# Patient Record
Sex: Female | Born: 1960
Health system: Southern US, Community
[De-identification: ages and names within clinical notes are randomized; demographics above are authoritative.]

---

## 1999-12-13 ENCOUNTER — Ambulatory Visit (HOSPITAL_BASED_OUTPATIENT_CLINIC_OR_DEPARTMENT_OTHER): Admission: RE | Admit: 1999-12-13 | Discharge: 1999-12-13 | Payer: Self-pay | Admitting: *Deleted

## 2002-02-18 ENCOUNTER — Other Ambulatory Visit: Admission: RE | Admit: 2002-02-18 | Discharge: 2002-02-18 | Payer: Self-pay | Admitting: Obstetrics and Gynecology

## 2003-02-18 ENCOUNTER — Other Ambulatory Visit: Admission: RE | Admit: 2003-02-18 | Discharge: 2003-02-18 | Payer: Self-pay | Admitting: Obstetrics and Gynecology

## 2004-07-03 ENCOUNTER — Other Ambulatory Visit: Admission: RE | Admit: 2004-07-03 | Discharge: 2004-07-03 | Payer: Self-pay | Admitting: Obstetrics and Gynecology

## 2005-01-04 ENCOUNTER — Other Ambulatory Visit: Admission: RE | Admit: 2005-01-04 | Discharge: 2005-01-04 | Payer: Self-pay | Admitting: Obstetrics and Gynecology

## 2005-07-25 ENCOUNTER — Other Ambulatory Visit: Admission: RE | Admit: 2005-07-25 | Discharge: 2005-07-25 | Payer: Self-pay | Admitting: Obstetrics and Gynecology

## 2010-03-15 ENCOUNTER — Encounter: Admission: RE | Admit: 2010-03-15 | Discharge: 2010-03-15 | Payer: Self-pay | Admitting: Internal Medicine

## 2010-05-18 ENCOUNTER — Ambulatory Visit (HOSPITAL_COMMUNITY): Admission: RE | Admit: 2010-05-18 | Discharge: 2010-05-18 | Payer: Self-pay | Admitting: Surgery

## 2010-09-21 ENCOUNTER — Ambulatory Visit (HOSPITAL_COMMUNITY): Admission: RE | Admit: 2010-09-21 | Discharge: 2010-09-21 | Payer: Self-pay | Admitting: Gastroenterology

## 2010-11-26 ENCOUNTER — Encounter: Payer: Self-pay | Admitting: Obstetrics and Gynecology

## 2011-01-04 ENCOUNTER — Other Ambulatory Visit: Payer: Self-pay | Admitting: Dermatology

## 2011-01-21 LAB — COMPREHENSIVE METABOLIC PANEL
ALT: 16 U/L (ref 0–35)
AST: 18 U/L (ref 0–37)
BUN: 14 mg/dL (ref 6–23)
GFR calc Af Amer: >60 mL/min (ref >60)
Glucose, Bld: 101 mg/dL — ABNORMAL HIGH (ref 70–99)
Sodium: 138 mEq/L (ref 135–145)
Total Bilirubin: 0.4 mg/dL (ref 0.3–1.2)

## 2011-01-21 LAB — CBC
HCT: 33.2 % — ABNORMAL LOW (ref 36.0–46.0)
MCHC: 34.4 g/dL (ref 30.0–36.0)
MCV: 85.1 fL (ref 78.0–100.0)

## 2011-01-21 LAB — SURGICAL PCR SCREEN: Staphylococcus aureus: POSITIVE — AB

## 2011-03-02 ENCOUNTER — Other Ambulatory Visit: Payer: Self-pay | Admitting: Dermatology

## 2011-03-23 NOTE — Op Note (Signed)
[place]  Patient:    Debbie Wallace, Debbie Wallace                          MRN: [id number] Proc. Date: 12/13/99 Adm. Date:  [id number] Attending:  Stephenie Acres                           Operative Report  PREOPERATIVE DIAGNOSIS:  Left breast mass.  POSTOPERATIVE DIAGNOSIS:  Left breast mass.  OPERATION PERFORMED:  Excisional left breast biopsy.  SURGEON:  Stephenie Acres, M.D.  ANESTHESIA:  General.  DESCRIPTION OF PROCEDURE:  The patient was taken to the operating room and placed in supine position.  After adequate anesthesia was induced using MAC technique, the left breast was prepped and draped in normal sterile fashion.  Using 1% lidocaine local anesthesia, the skin and subcutaneous tissue surrounding the palpable mass was anesthetized.  A curvilinear peri-areolar incision was made in the 5 oclock  region of the left breast.  Dissected down excising the mass in its entirely and surrounding tissue.  Adequate hemostasis was ensured and the skin was closed with subcuticular 4-0 Monocryl.  Steri-Strips, sterile dressings were applied.  The patient tolerated the procedure well and went to PACU in good condition. DD:  12/13/99 TD:  12/13/99 Job: [id number]

## 2014-08-25 ENCOUNTER — Other Ambulatory Visit: Payer: Self-pay | Admitting: Obstetrics and Gynecology

## 2014-08-26 LAB — CYTOLOGY - PAP

## 2014-09-01 ENCOUNTER — Other Ambulatory Visit: Payer: Self-pay | Admitting: Obstetrics and Gynecology

## 2014-09-01 DIAGNOSIS — R928 Other abnormal and inconclusive findings on diagnostic imaging of breast: Secondary | ICD-10-CM

## 2014-09-13 ENCOUNTER — Ambulatory Visit
Admission: RE | Admit: 2014-09-13 | Discharge: 2014-09-13 | Disposition: A | Discharge status: Home / Self Care | Payer: BC Managed Care – PPO | Source: Ambulatory Visit | Attending: Obstetrics and Gynecology | Admitting: Obstetrics and Gynecology

## 2014-09-13 DIAGNOSIS — R928 Other abnormal and inconclusive findings on diagnostic imaging of breast: Secondary | ICD-10-CM

## 2016-04-23 DIAGNOSIS — E782 Mixed hyperlipidemia: Secondary | ICD-10-CM | POA: Diagnosis not present

## 2016-04-23 DIAGNOSIS — E039 Hypothyroidism, unspecified: Secondary | ICD-10-CM | POA: Diagnosis not present

## 2016-04-23 DIAGNOSIS — Z0000 Encounter for general adult medical examination without abnormal findings: Secondary | ICD-10-CM | POA: Diagnosis not present

## 2016-06-28 DIAGNOSIS — E039 Hypothyroidism, unspecified: Secondary | ICD-10-CM | POA: Diagnosis not present

## 2016-10-04 DIAGNOSIS — Z01419 Encounter for gynecological examination (general) (routine) without abnormal findings: Secondary | ICD-10-CM | POA: Diagnosis not present

## 2016-10-04 DIAGNOSIS — Z01411 Encounter for gynecological examination (general) (routine) with abnormal findings: Secondary | ICD-10-CM | POA: Diagnosis not present

## 2016-10-04 DIAGNOSIS — Z6836 Body mass index (BMI) 36.0-36.9, adult: Secondary | ICD-10-CM | POA: Diagnosis not present

## 2016-10-04 DIAGNOSIS — N390 Urinary tract infection, site not specified: Secondary | ICD-10-CM | POA: Diagnosis not present

## 2017-01-10 DIAGNOSIS — D1801 Hemangioma of skin and subcutaneous tissue: Secondary | ICD-10-CM | POA: Diagnosis not present

## 2017-01-10 DIAGNOSIS — D2361 Other benign neoplasm of skin of right upper limb, including shoulder: Secondary | ICD-10-CM | POA: Diagnosis not present

## 2017-01-10 DIAGNOSIS — L820 Inflamed seborrheic keratosis: Secondary | ICD-10-CM | POA: Diagnosis not present

## 2017-01-10 DIAGNOSIS — L918 Other hypertrophic disorders of the skin: Secondary | ICD-10-CM | POA: Diagnosis not present

## 2017-01-10 DIAGNOSIS — L821 Other seborrheic keratosis: Secondary | ICD-10-CM | POA: Diagnosis not present

## 2017-01-10 DIAGNOSIS — D2371 Other benign neoplasm of skin of right lower limb, including hip: Secondary | ICD-10-CM | POA: Diagnosis not present

## 2017-03-28 DIAGNOSIS — W57XXXA Bitten or stung by nonvenomous insect and other nonvenomous arthropods, initial encounter: Secondary | ICD-10-CM | POA: Diagnosis not present

## 2017-03-28 DIAGNOSIS — S30861A Insect bite (nonvenomous) of abdominal wall, initial encounter: Secondary | ICD-10-CM | POA: Diagnosis not present

## 2017-06-03 DIAGNOSIS — E039 Hypothyroidism, unspecified: Secondary | ICD-10-CM | POA: Diagnosis not present

## 2017-06-03 DIAGNOSIS — E782 Mixed hyperlipidemia: Secondary | ICD-10-CM | POA: Diagnosis not present

## 2017-08-29 DIAGNOSIS — Z23 Encounter for immunization: Secondary | ICD-10-CM | POA: Diagnosis not present

## 2017-08-29 DIAGNOSIS — G8929 Other chronic pain: Secondary | ICD-10-CM | POA: Diagnosis not present

## 2017-08-29 DIAGNOSIS — M25511 Pain in right shoulder: Secondary | ICD-10-CM | POA: Diagnosis not present

## 2017-10-15 DIAGNOSIS — Z01419 Encounter for gynecological examination (general) (routine) without abnormal findings: Secondary | ICD-10-CM | POA: Diagnosis not present

## 2017-10-15 DIAGNOSIS — Z1231 Encounter for screening mammogram for malignant neoplasm of breast: Secondary | ICD-10-CM | POA: Diagnosis not present

## 2017-10-15 DIAGNOSIS — Z6834 Body mass index (BMI) 34.0-34.9, adult: Secondary | ICD-10-CM | POA: Diagnosis not present

## 2017-11-13 DIAGNOSIS — Z13820 Encounter for screening for osteoporosis: Secondary | ICD-10-CM | POA: Diagnosis not present

## 2018-05-03 DIAGNOSIS — L259 Unspecified contact dermatitis, unspecified cause: Secondary | ICD-10-CM | POA: Diagnosis not present

## 2018-05-03 DIAGNOSIS — H02843 Edema of right eye, unspecified eyelid: Secondary | ICD-10-CM | POA: Diagnosis not present

## 2018-05-09 DIAGNOSIS — Z0000 Encounter for general adult medical examination without abnormal findings: Secondary | ICD-10-CM | POA: Diagnosis not present

## 2018-05-09 DIAGNOSIS — E782 Mixed hyperlipidemia: Secondary | ICD-10-CM | POA: Diagnosis not present

## 2018-05-09 DIAGNOSIS — Z23 Encounter for immunization: Secondary | ICD-10-CM | POA: Diagnosis not present

## 2018-05-09 DIAGNOSIS — E039 Hypothyroidism, unspecified: Secondary | ICD-10-CM | POA: Diagnosis not present

## 2018-05-09 DIAGNOSIS — E669 Obesity, unspecified: Secondary | ICD-10-CM | POA: Diagnosis not present

## 2018-06-25 DIAGNOSIS — E039 Hypothyroidism, unspecified: Secondary | ICD-10-CM | POA: Diagnosis not present

## 2018-08-25 DIAGNOSIS — E039 Hypothyroidism, unspecified: Secondary | ICD-10-CM | POA: Diagnosis not present

## 2018-10-31 DIAGNOSIS — E039 Hypothyroidism, unspecified: Secondary | ICD-10-CM | POA: Diagnosis not present

## 2018-11-19 DIAGNOSIS — D2262 Melanocytic nevi of left upper limb, including shoulder: Secondary | ICD-10-CM | POA: Diagnosis not present

## 2018-11-19 DIAGNOSIS — D2361 Other benign neoplasm of skin of right upper limb, including shoulder: Secondary | ICD-10-CM | POA: Diagnosis not present

## 2018-11-19 DIAGNOSIS — L738 Other specified follicular disorders: Secondary | ICD-10-CM | POA: Diagnosis not present

## 2018-11-19 DIAGNOSIS — L820 Inflamed seborrheic keratosis: Secondary | ICD-10-CM | POA: Diagnosis not present

## 2018-11-19 DIAGNOSIS — L821 Other seborrheic keratosis: Secondary | ICD-10-CM | POA: Diagnosis not present

## 2019-07-20 DIAGNOSIS — Z13228 Encounter for screening for other metabolic disorders: Secondary | ICD-10-CM | POA: Diagnosis not present

## 2019-07-20 DIAGNOSIS — Z1231 Encounter for screening mammogram for malignant neoplasm of breast: Secondary | ICD-10-CM | POA: Diagnosis not present

## 2019-07-20 DIAGNOSIS — Z01419 Encounter for gynecological examination (general) (routine) without abnormal findings: Secondary | ICD-10-CM | POA: Diagnosis not present

## 2019-07-20 DIAGNOSIS — Z6831 Body mass index (BMI) 31.0-31.9, adult: Secondary | ICD-10-CM | POA: Diagnosis not present

## 2019-07-20 DIAGNOSIS — Z13220 Encounter for screening for lipoid disorders: Secondary | ICD-10-CM | POA: Diagnosis not present

## 2019-07-20 DIAGNOSIS — Z1329 Encounter for screening for other suspected endocrine disorder: Secondary | ICD-10-CM | POA: Diagnosis not present

## 2019-07-22 ENCOUNTER — Other Ambulatory Visit: Payer: Self-pay | Admitting: Obstetrics and Gynecology

## 2019-07-22 DIAGNOSIS — R928 Other abnormal and inconclusive findings on diagnostic imaging of breast: Secondary | ICD-10-CM

## 2019-07-28 ENCOUNTER — Other Ambulatory Visit: Payer: Self-pay

## 2019-07-28 ENCOUNTER — Ambulatory Visit
Admission: RE | Admit: 2019-07-28 | Discharge: 2019-07-28 | Disposition: A | Discharge status: Home / Self Care | Payer: BC Managed Care – PPO | Source: Ambulatory Visit | Attending: Obstetrics and Gynecology | Admitting: Obstetrics and Gynecology

## 2019-07-28 ENCOUNTER — Other Ambulatory Visit: Payer: Self-pay | Admitting: Obstetrics and Gynecology

## 2019-07-28 DIAGNOSIS — N6322 Unspecified lump in the left breast, upper inner quadrant: Secondary | ICD-10-CM | POA: Diagnosis not present

## 2019-07-28 DIAGNOSIS — R928 Other abnormal and inconclusive findings on diagnostic imaging of breast: Secondary | ICD-10-CM

## 2019-07-28 DIAGNOSIS — N6320 Unspecified lump in the left breast, unspecified quadrant: Secondary | ICD-10-CM

## 2019-07-30 ENCOUNTER — Other Ambulatory Visit: Payer: Self-pay | Admitting: Obstetrics and Gynecology

## 2019-08-20 ENCOUNTER — Ambulatory Visit
Admission: RE | Admit: 2019-08-20 | Discharge: 2019-08-20 | Disposition: A | Discharge status: Home / Self Care | Payer: BLUE CROSS/BLUE SHIELD | Source: Ambulatory Visit | Attending: Internal Medicine | Admitting: Internal Medicine

## 2019-08-20 ENCOUNTER — Other Ambulatory Visit: Payer: Self-pay | Admitting: Internal Medicine

## 2019-08-20 DIAGNOSIS — E039 Hypothyroidism, unspecified: Secondary | ICD-10-CM | POA: Diagnosis not present

## 2019-08-20 DIAGNOSIS — R05 Cough: Secondary | ICD-10-CM | POA: Diagnosis not present

## 2019-08-20 DIAGNOSIS — E782 Mixed hyperlipidemia: Secondary | ICD-10-CM | POA: Diagnosis not present

## 2019-08-20 DIAGNOSIS — R059 Cough, unspecified: Secondary | ICD-10-CM

## 2019-08-20 DIAGNOSIS — Z23 Encounter for immunization: Secondary | ICD-10-CM | POA: Diagnosis not present

## 2019-12-04 DIAGNOSIS — E039 Hypothyroidism, unspecified: Secondary | ICD-10-CM | POA: Diagnosis not present

## 2020-01-26 ENCOUNTER — Other Ambulatory Visit: Payer: BC Managed Care – PPO

## 2020-01-26 ENCOUNTER — Other Ambulatory Visit: Payer: Self-pay

## 2020-02-01 DIAGNOSIS — Z20828 Contact with and (suspected) exposure to other viral communicable diseases: Secondary | ICD-10-CM | POA: Diagnosis not present

## 2020-02-01 DIAGNOSIS — Z03818 Encounter for observation for suspected exposure to other biological agents ruled out: Secondary | ICD-10-CM | POA: Diagnosis not present

## 2020-03-21 ENCOUNTER — Other Ambulatory Visit: Payer: Self-pay

## 2020-04-29 ENCOUNTER — Other Ambulatory Visit: Payer: Self-pay

## 2020-04-29 ENCOUNTER — Ambulatory Visit
Admission: RE | Admit: 2020-04-29 | Discharge: 2020-04-29 | Disposition: A | Discharge status: Home / Self Care | Payer: BC Managed Care – PPO | Source: Ambulatory Visit | Attending: Obstetrics and Gynecology | Admitting: Obstetrics and Gynecology

## 2020-04-29 ENCOUNTER — Other Ambulatory Visit: Payer: Self-pay | Admitting: Obstetrics and Gynecology

## 2020-04-29 DIAGNOSIS — N6489 Other specified disorders of breast: Secondary | ICD-10-CM | POA: Diagnosis not present

## 2020-04-29 DIAGNOSIS — N6320 Unspecified lump in the left breast, unspecified quadrant: Secondary | ICD-10-CM

## 2020-04-29 DIAGNOSIS — R928 Other abnormal and inconclusive findings on diagnostic imaging of breast: Secondary | ICD-10-CM | POA: Diagnosis not present

## 2020-05-06 ENCOUNTER — Ambulatory Visit
Admission: RE | Admit: 2020-05-06 | Discharge: 2020-05-06 | Disposition: A | Discharge status: Home / Self Care | Payer: BC Managed Care – PPO | Source: Ambulatory Visit | Attending: Obstetrics and Gynecology | Admitting: Obstetrics and Gynecology

## 2020-05-06 ENCOUNTER — Other Ambulatory Visit: Payer: Self-pay

## 2020-05-06 DIAGNOSIS — N6320 Unspecified lump in the left breast, unspecified quadrant: Secondary | ICD-10-CM

## 2020-05-06 DIAGNOSIS — N6322 Unspecified lump in the left breast, upper inner quadrant: Secondary | ICD-10-CM | POA: Diagnosis not present

## 2020-05-06 DIAGNOSIS — N6012 Diffuse cystic mastopathy of left breast: Secondary | ICD-10-CM | POA: Diagnosis not present

## 2020-07-29 IMAGING — US US BREAST*L* LIMITED INC AXILLA
1 series · 9 of 9 positions shown · non-contrast
Comparison: Previous exam(s).

CLINICAL DATA: Screening recall for a possible left breast mass.

EXAM:
DIGITAL DIAGNOSTIC LEFT MAMMOGRAM WITH CAD AND TOMO
ULTRASOUND LEFT BREAST

[Series 1: us breast*left* limited inc axilla · 0.06mm/px · 9 of 9 slices shown]
[im 1/9]
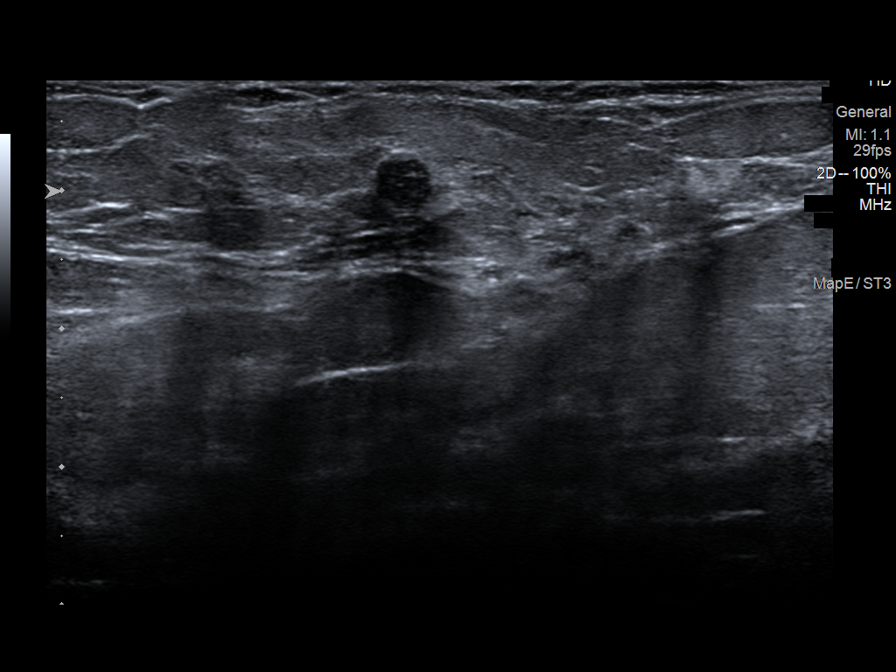
[im 2/9]
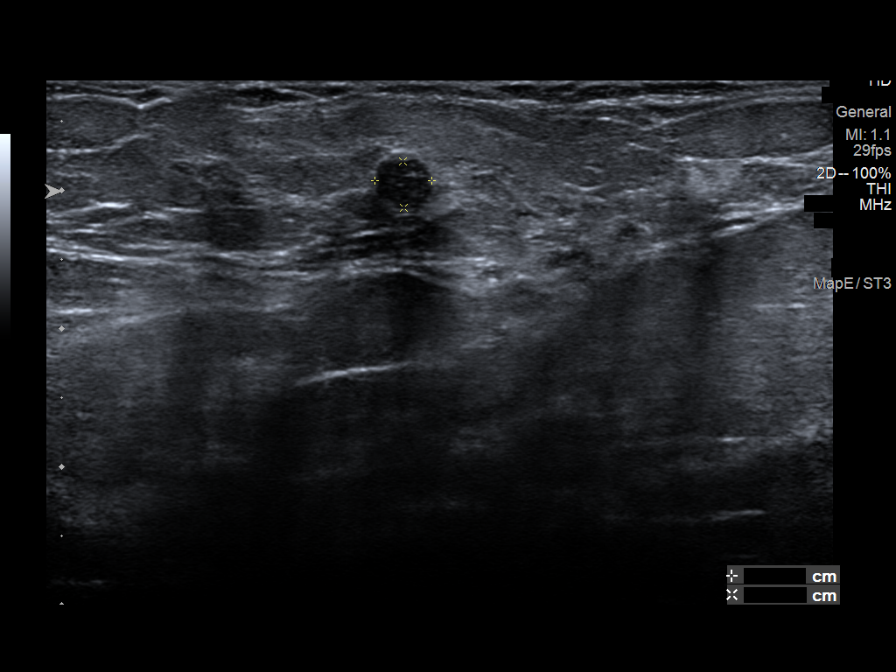
[im 3/9]
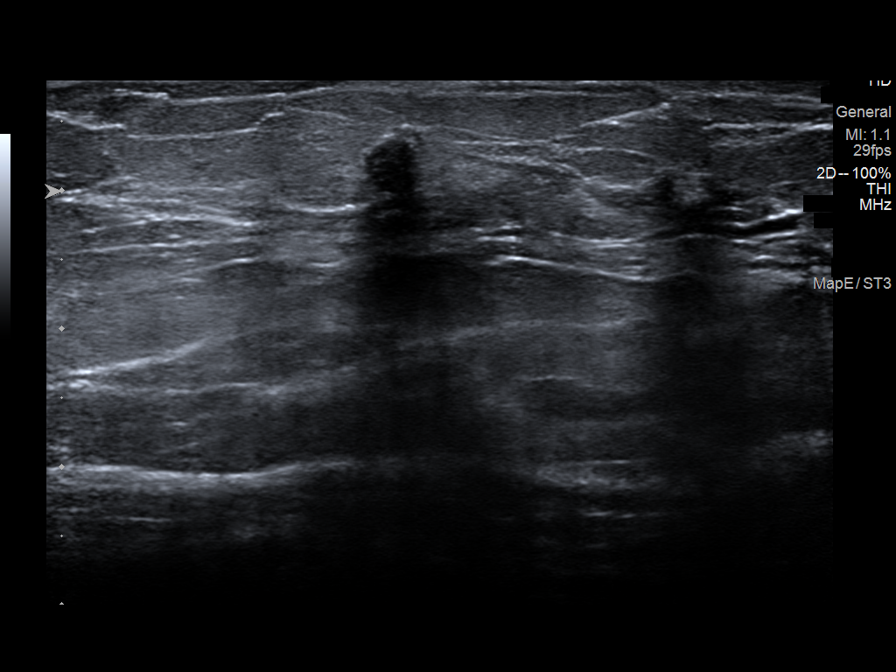
[im 4/9]
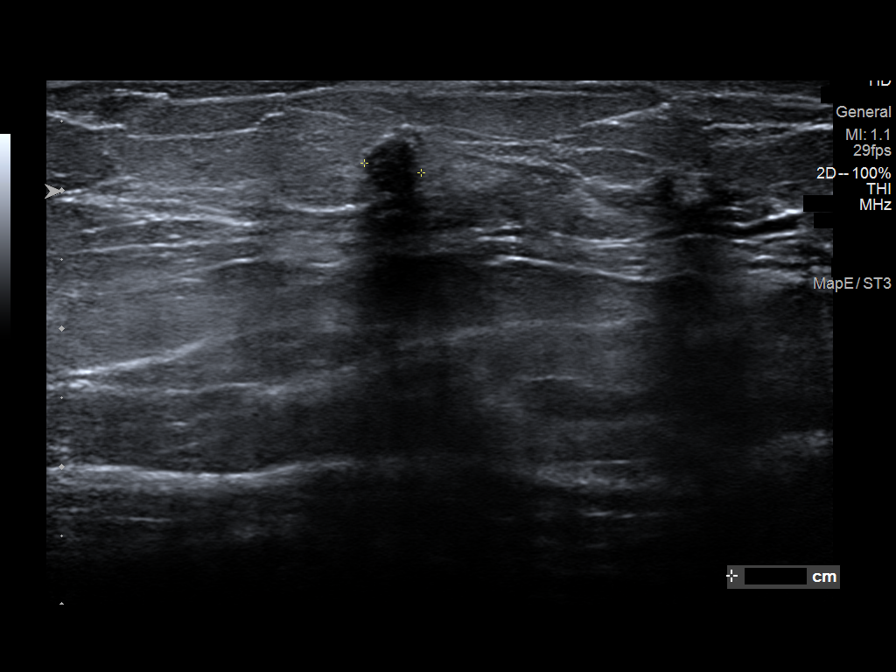
[im 5/9]
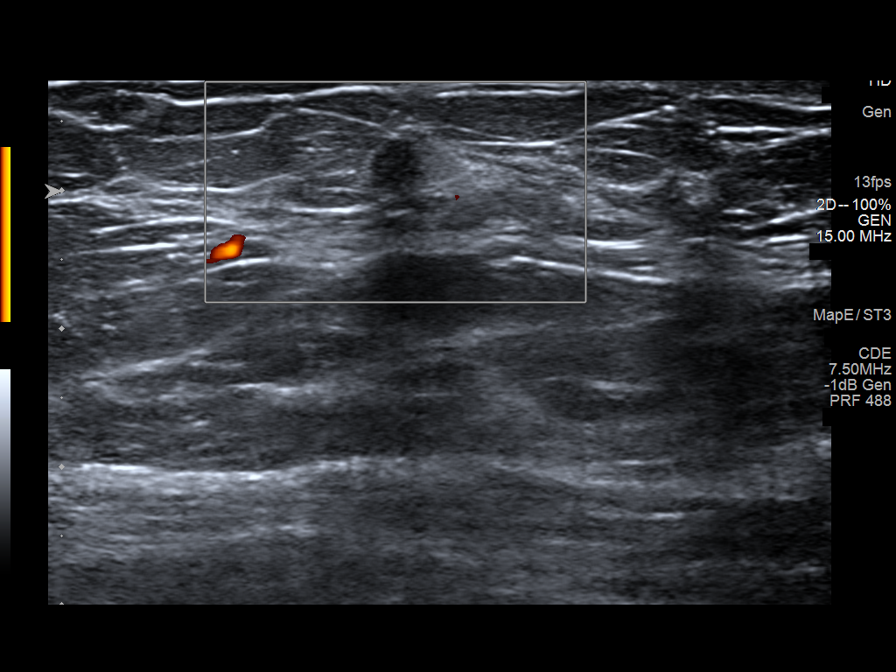
[im 6/9]
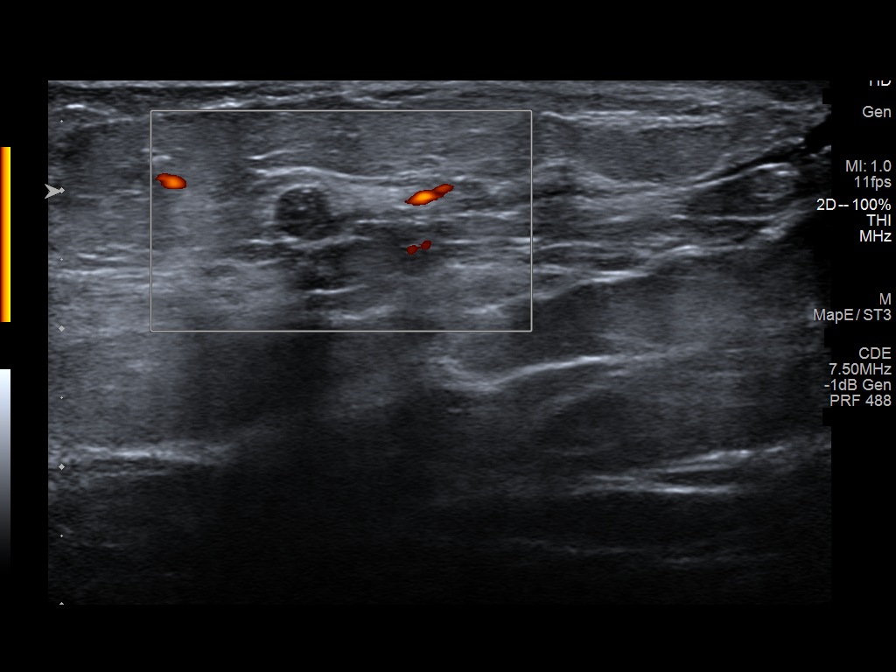
[im 7/9]
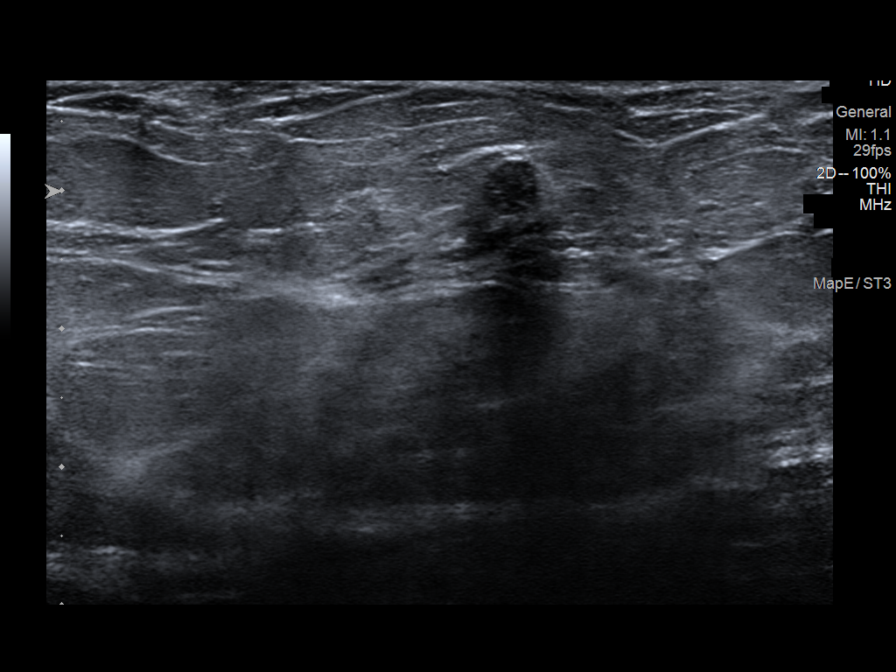
[im 8/9]
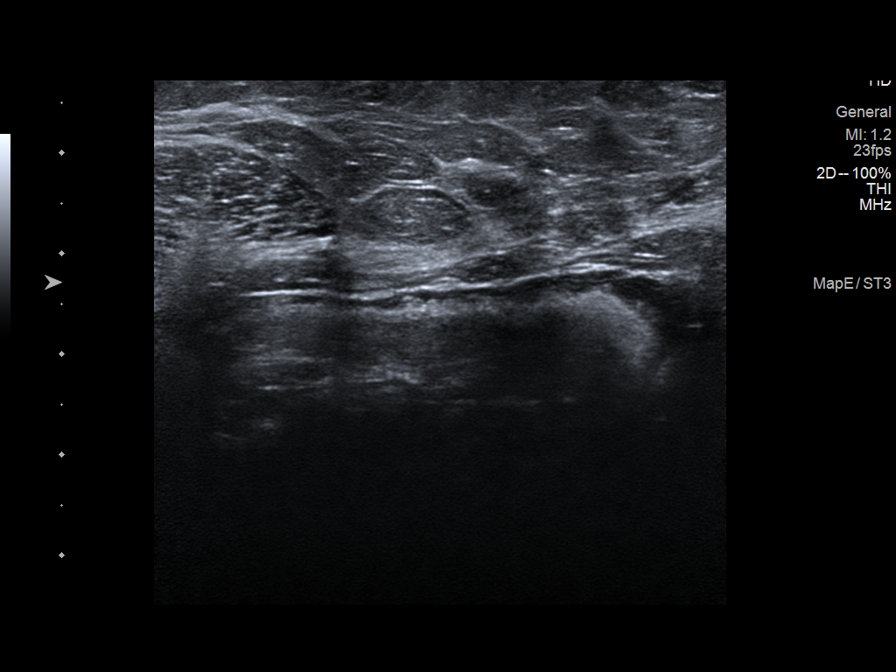
[im 9/9]
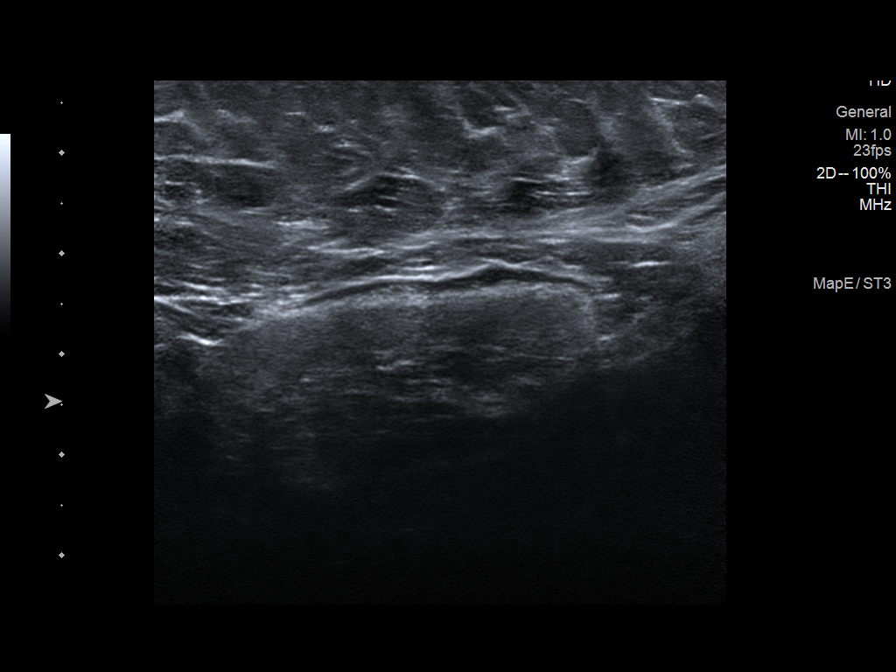

[9 of 9 positions shown; findings below may reference images not displayed]

ACR Breast Density Category b: There are scattered areas of
fibroglandular density.
FINDINGS: Spot compression tomosynthesis images reveal a persistent oval
circumscribed 4-5 mm mass in the retroareolar left breast in the
upper inner quadrant. This mass could possibly have been there back
in 7618, however it is difficult to compare as those images were 2D
and the current images are 3D.

Mammographic images were processed with CAD.

Ultrasound targeted to the left breast at 10 o'clock, 2 cm from the
nipple demonstrates a circumscribed round hypoechoic mass measuring
4 x 3 x 4 mm. Ultrasound of the left axilla demonstrates multiple
normal-appearing lymph nodes.
IMPRESSION: There is a likely benign mass in the left breast at 10 o'clock.

RECOMMENDATION:
Six-month follow-up diagnostic left breast mammogram and ultrasound
is recommended.

I have discussed the findings and recommendations with the patient.
If applicable, a reminder letter will be sent to the patient
regarding the next appointment.

BI-RADS CATEGORY  3: Probably benign.

## 2020-10-18 DIAGNOSIS — Z23 Encounter for immunization: Secondary | ICD-10-CM | POA: Diagnosis not present

## 2021-05-08 IMAGING — US US BREAST BX W LOC DEV 1ST LESION IMG BX SPEC US GUIDE*L*
1 series · 10 of 10 positions shown · non-contrast
Comparison: Previous exam(s).
COMPARISON: Previous exam(s).

Addendum:
CLINICAL DATA: Patient presents for ultrasound-guided core needle
biopsy of a 4 mm left breast mass.

EXAM:
ULTRASOUND GUIDED LEFT BREAST CORE NEEDLE BIOPSY

[Series 1: us breast bx w loc dev 1st lesion img bx spec us g · 0.06mm/px · 10 of 10 slices shown]
[im 1/10]
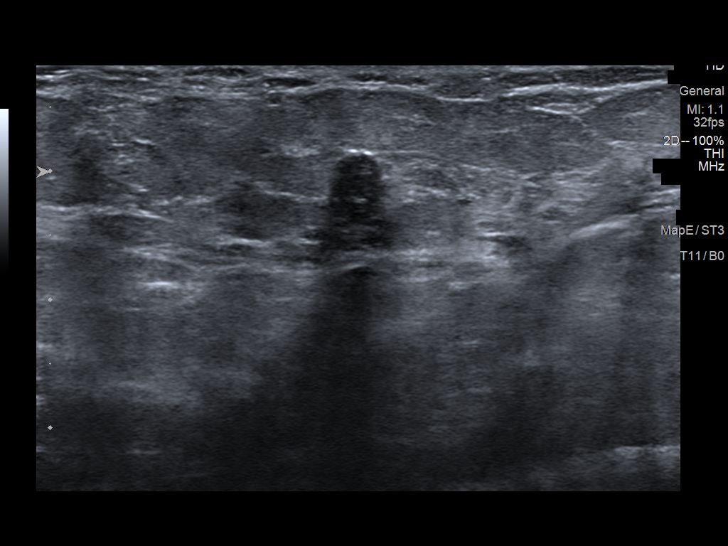
[im 2/10]
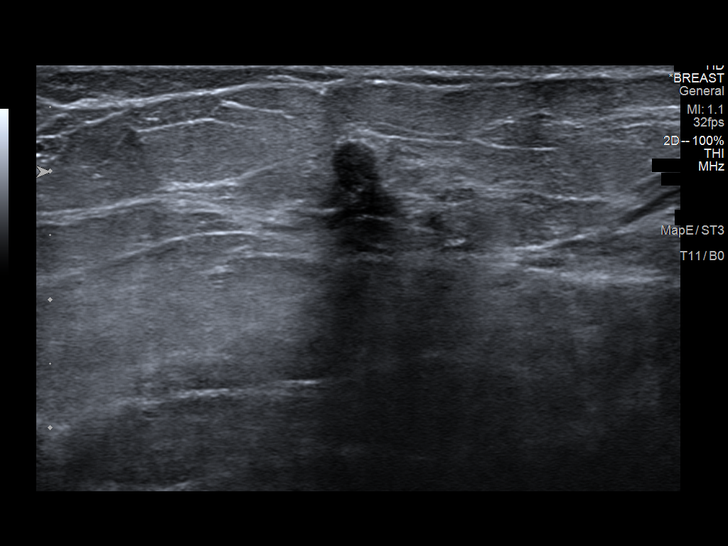
[im 3/10]
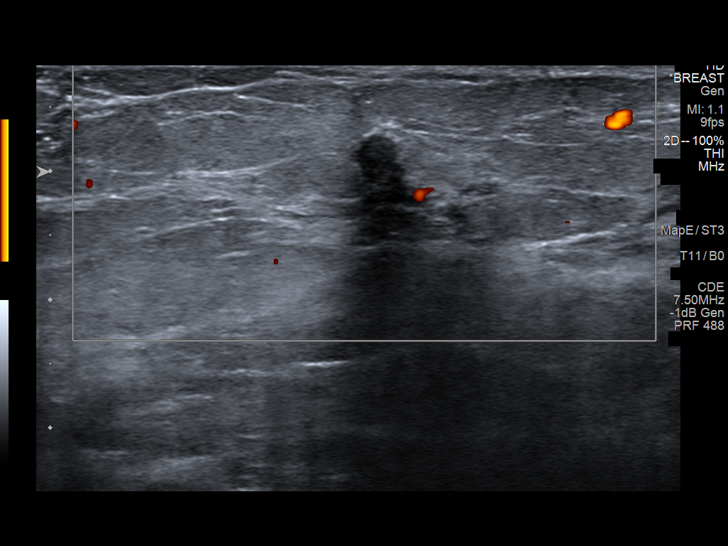
[im 4/10]
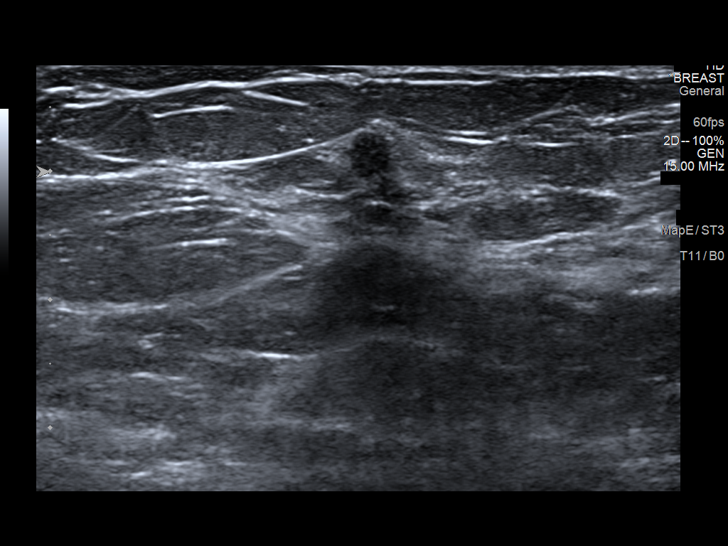
[im 5/10]
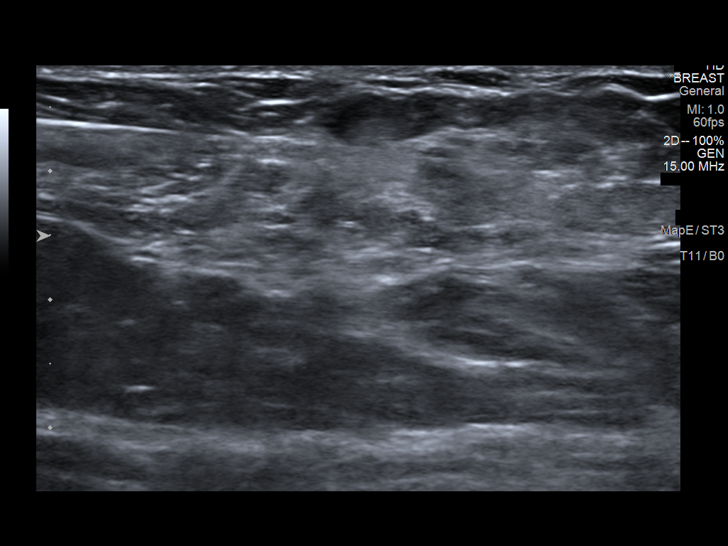
[im 6/10]
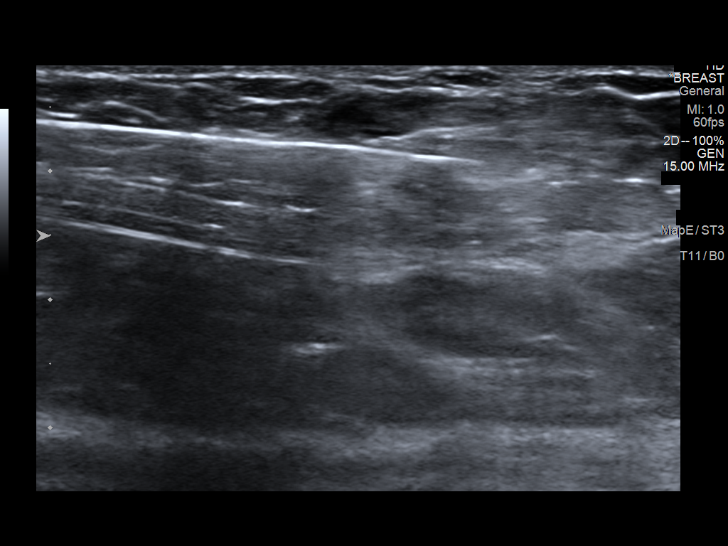
[im 7/10]
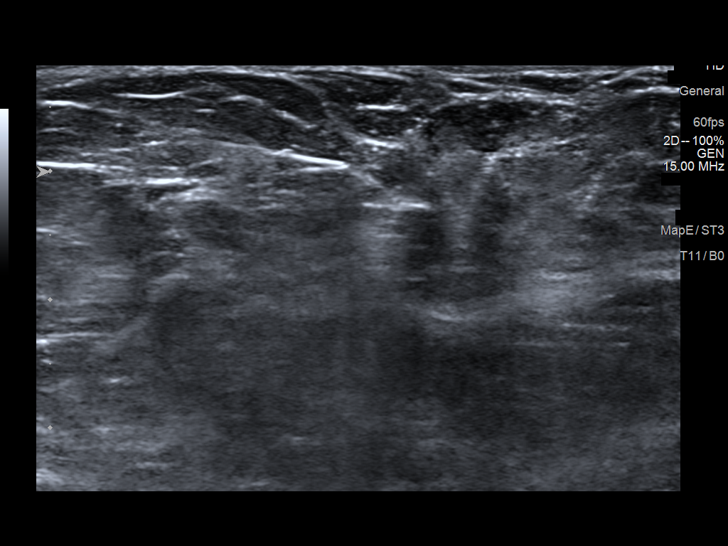
[im 8/10]
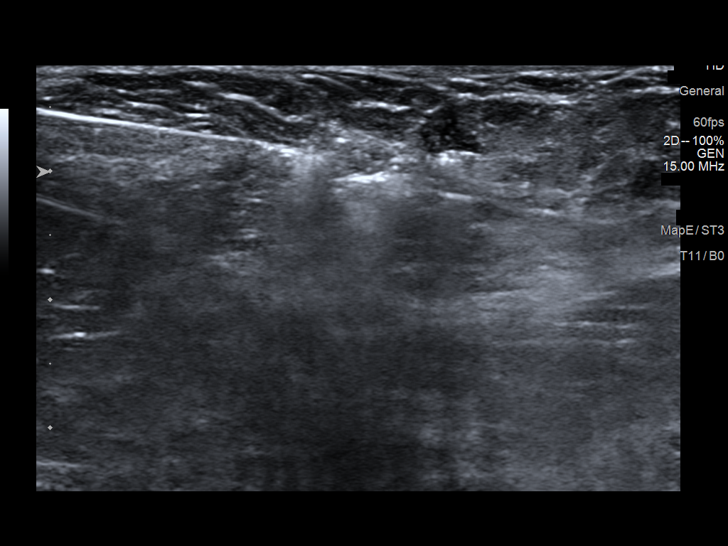
[im 9/10]
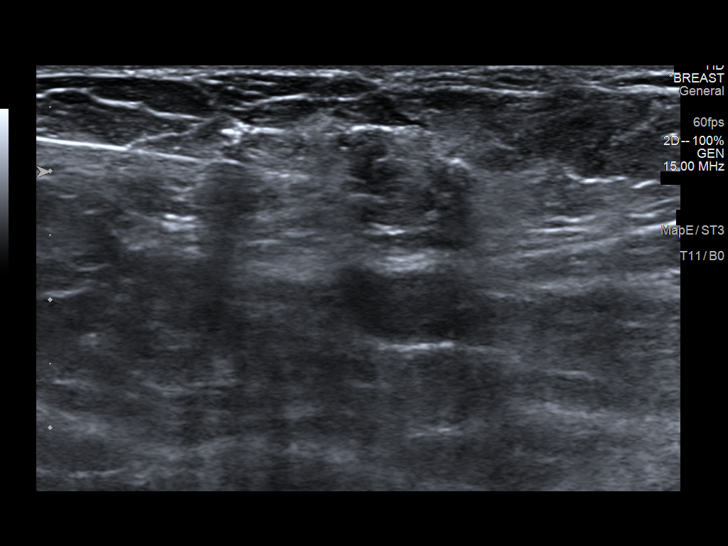
[im 10/10]
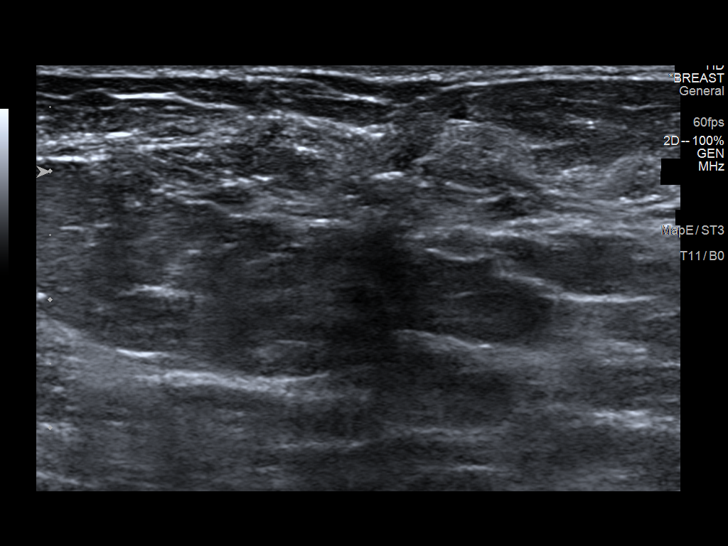

[10 of 10 positions shown; findings below may reference images not displayed]



Lesion quadrant: Upper inner quadrant

Using sterile technique and 1% Lidocaine as local anesthetic, under
direct ultrasound visualization, a 12 gauge Viray device was
used to perform biopsy of the 4 mm mass at 10 o'clock using an
inferior approach. At the conclusion of the procedure ribbon shaped
tissue marker clip was deployed into the biopsy cavity. Follow up 2
view mammogram was performed and dictated separately.
IMPRESSION: Ultrasound guided biopsy of a small left breast mass. No apparent
complications.

ADDENDUM:
Pathology revealed BENIGN CYST AND CYST WALL WITH SURROUNDING
CHRONIC INFLAMMATION of the Left breast, 10 o'clock, 6cmfn. This was
found to be concordant by Dr. Stilqn Pountchev.

Pathology results were discussed with the patient by telephone. The
patient reported doing well after the biopsy with minimal tenderness
at the site. Post biopsy instructions and care were reviewed and
questions were answered. The patient was encouraged to call The
direct phone number was provided for the patient.

The patient was asked to return for Left diagnostic mammography and
ultrasound in 6 months and informed a reminder notice would be sent
regarding this appointment.

Pathology results reported by Lucio Varghese, RN on 05/10/2020.



Lesion quadrant: Upper inner quadrant

Using sterile technique and 1% Lidocaine as local anesthetic, under
direct ultrasound visualization, a 12 gauge Viray device was
used to perform biopsy of the 4 mm mass at 10 o'clock using an
inferior approach. At the conclusion of the procedure ribbon shaped
tissue marker clip was deployed into the biopsy cavity. Follow up 2
view mammogram was performed and dictated separately.
IMPRESSION: Ultrasound guided biopsy of a small left breast mass. No apparent
complications.

## 2021-05-08 IMAGING — MG MM BREAST LOCALIZATION CLIP
4 series · 4 of 12 positions shown · non-contrast
Comparison: Prior exams

CLINICAL DATA: Evaluate post biopsy marker clip placement following
ultrasound-guided core needle biopsy of a small left breast mass.

EXAM:
DIAGNOSTIC LEFT MAMMOGRAM POST ULTRASOUND BIOPSY

[L CC synth-2D]
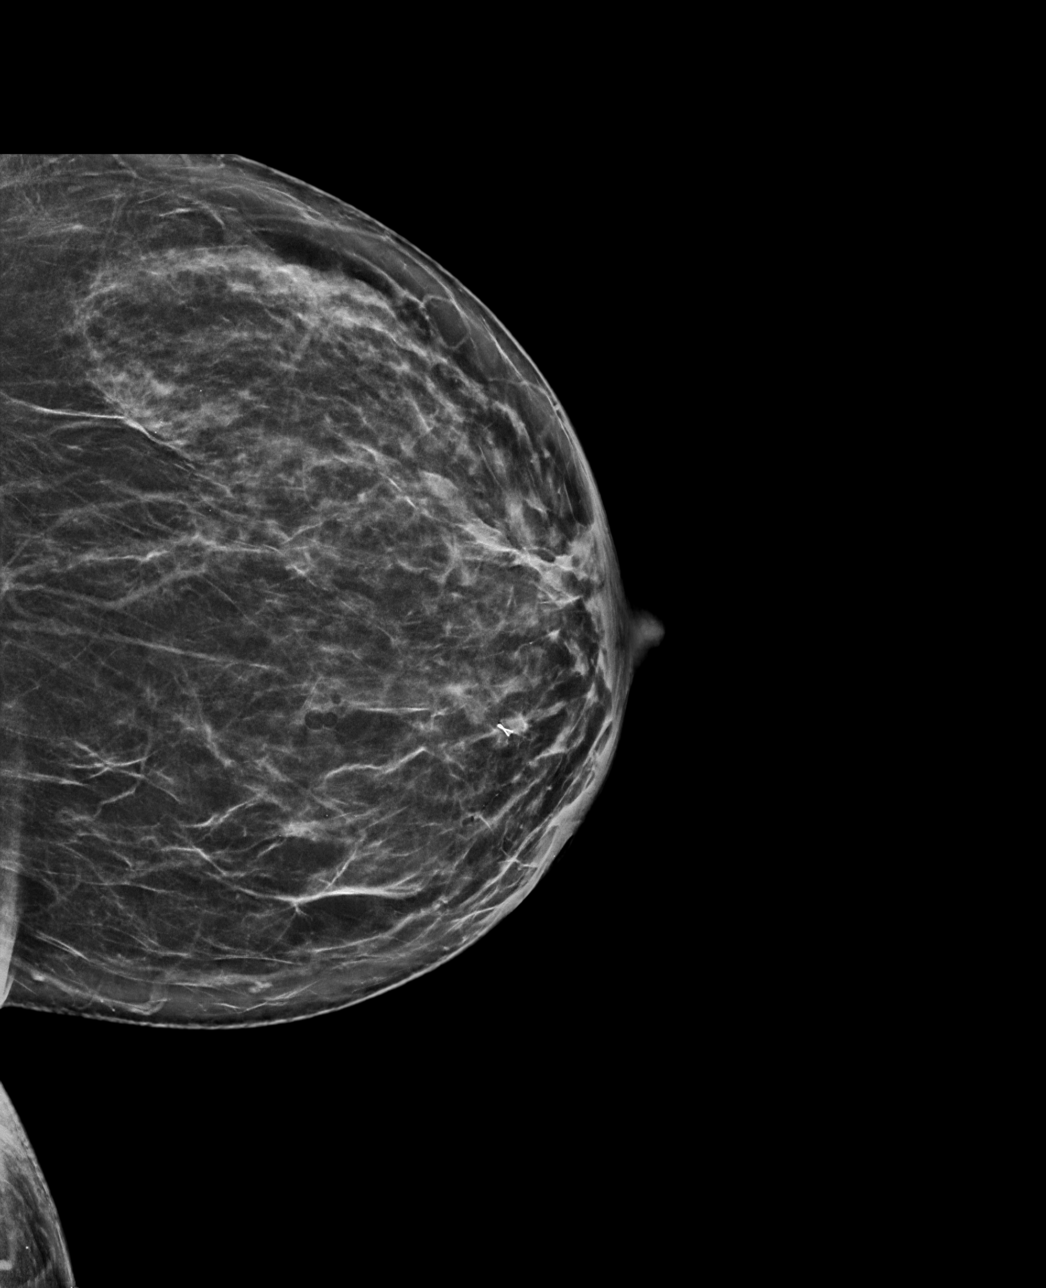

[L ML synth-2D]
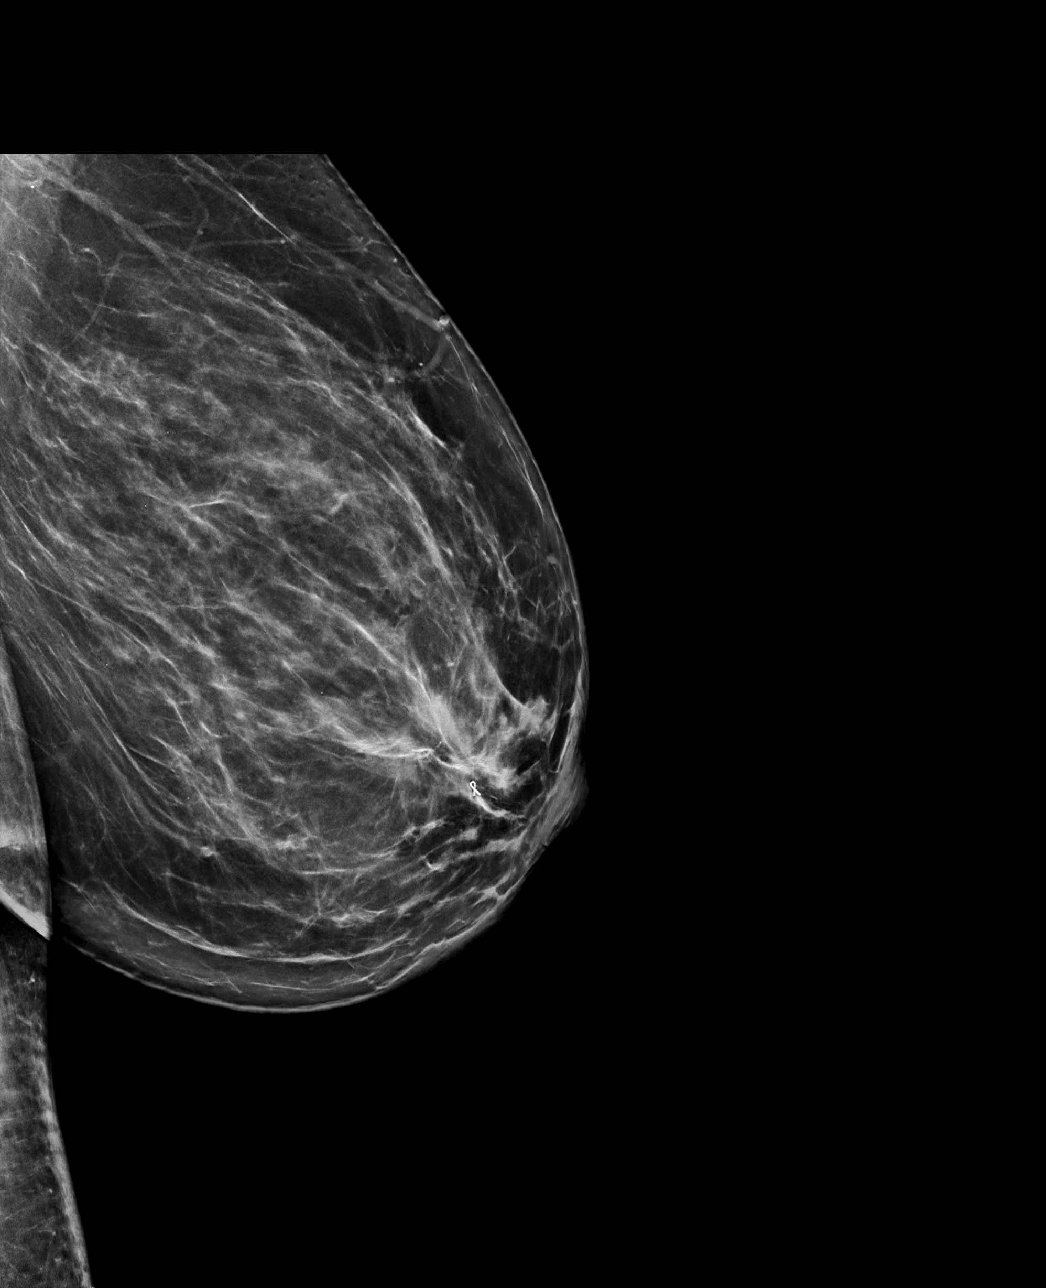

[L ML tomo · tomo slice 41/81.0]
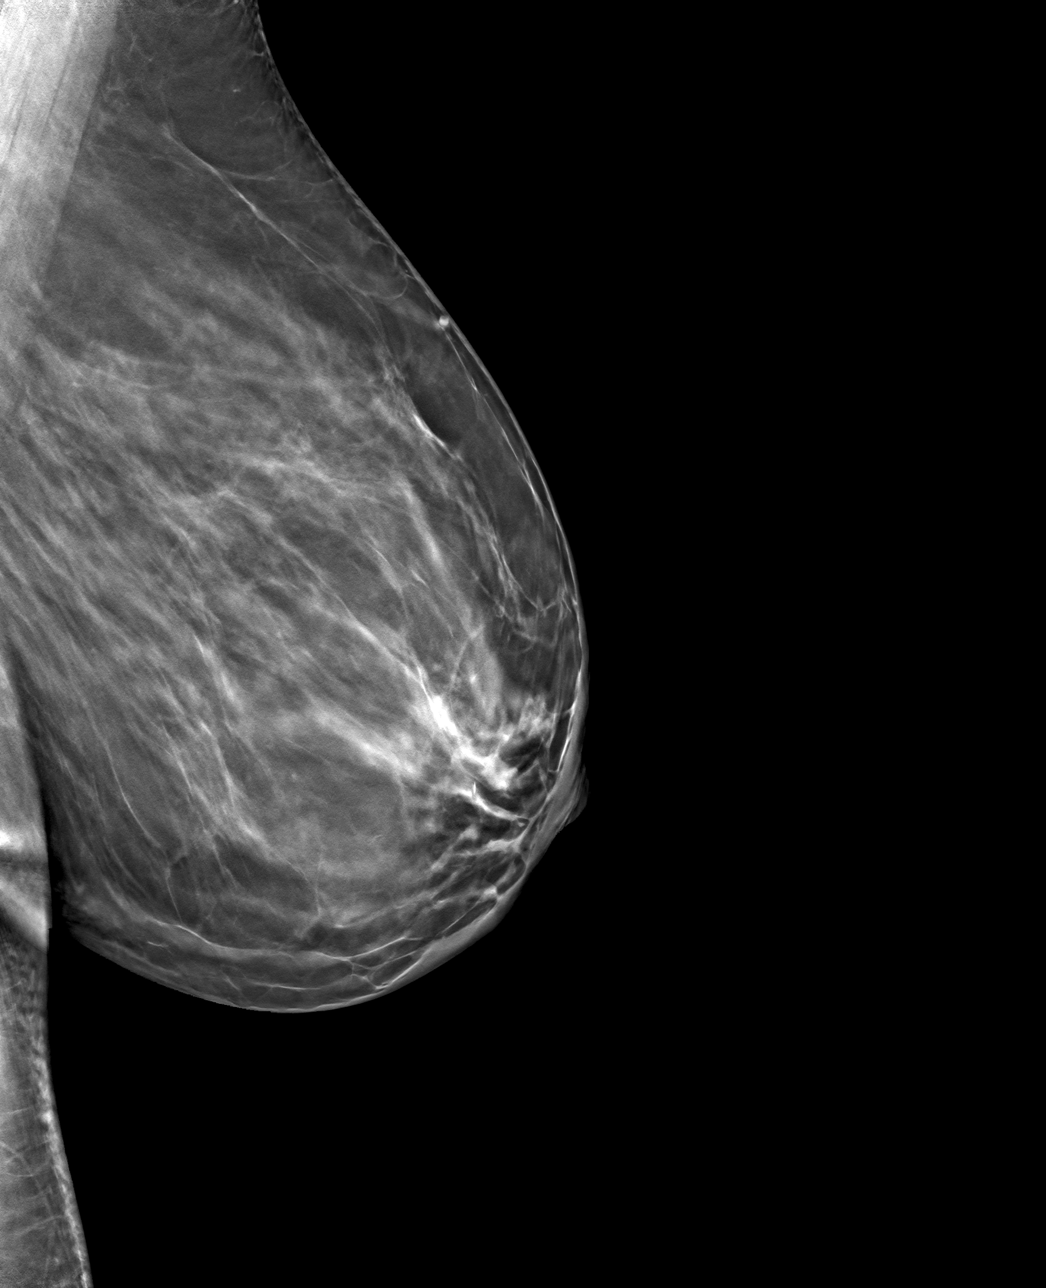

[L CC tomo · tomo slice 37/73.0]
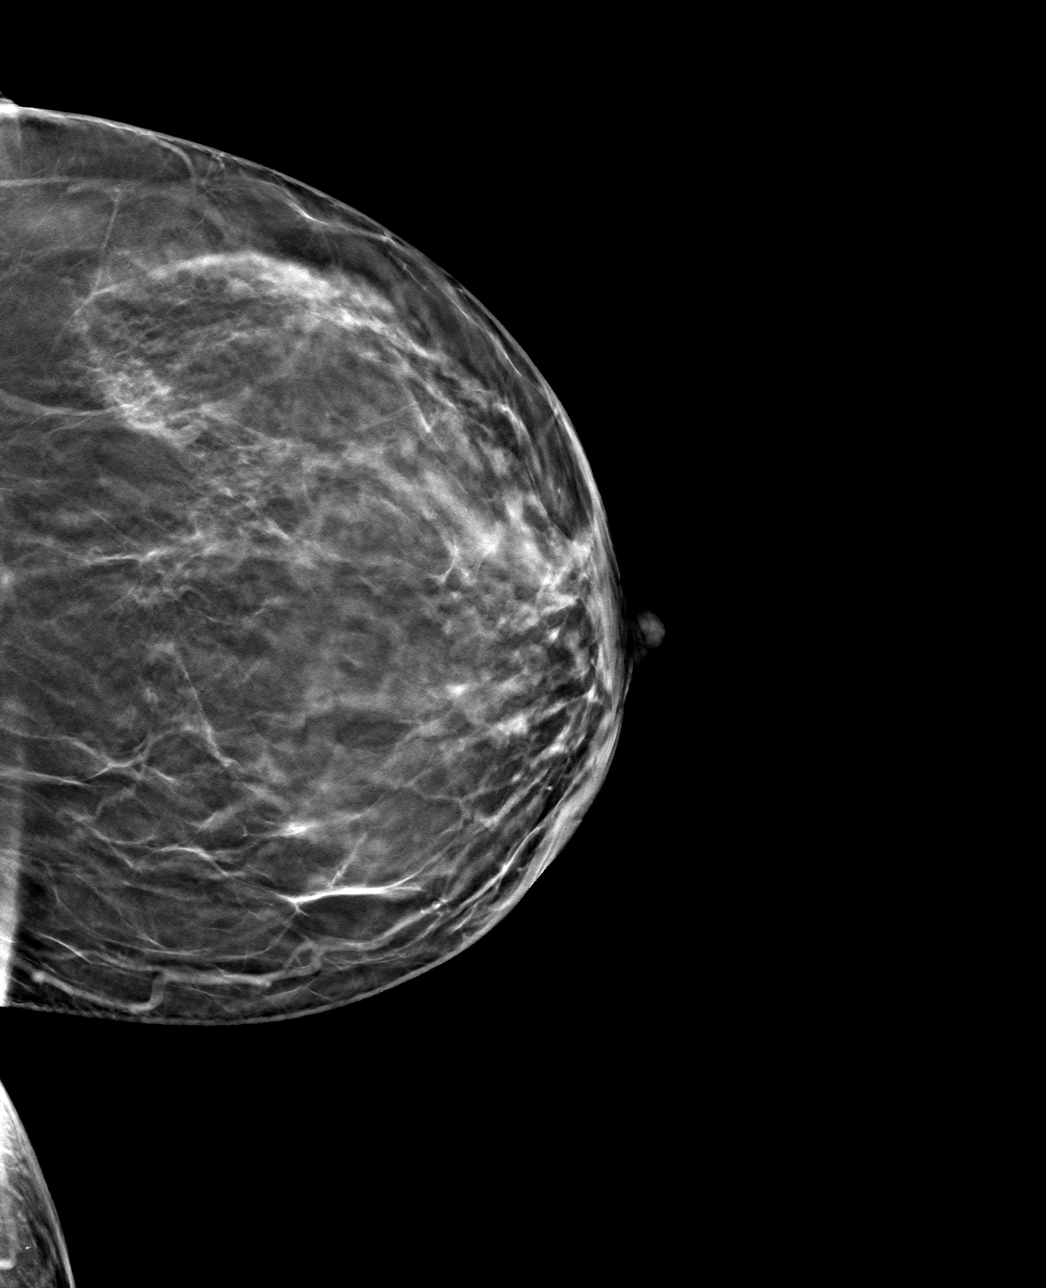

[4 of 12 positions shown; findings below may reference images not displayed]

FINDINGS: Mammographic images were obtained following ultrasound guided biopsy
of small upper-outer quadrant left breast mass. The biopsy marking
clip is in expected position at the site of biopsy.
IMPRESSION: Appropriate positioning of the ribbon shaped biopsy marking clip at
the site of biopsy in the upper inner quadrant of the left breast
associated with a small ill-defined mass. This is a separate area
than the original questionable distortion, which was noted in the
lateral subareolar left breast.

Final Assessment: Post Procedure Mammograms for Marker Placement

## 2021-12-26 DIAGNOSIS — L918 Other hypertrophic disorders of the skin: Secondary | ICD-10-CM | POA: Diagnosis not present

## 2021-12-26 DIAGNOSIS — L720 Epidermal cyst: Secondary | ICD-10-CM | POA: Diagnosis not present

## 2021-12-26 DIAGNOSIS — L570 Actinic keratosis: Secondary | ICD-10-CM | POA: Diagnosis not present

## 2021-12-26 DIAGNOSIS — D2361 Other benign neoplasm of skin of right upper limb, including shoulder: Secondary | ICD-10-CM | POA: Diagnosis not present

## 2021-12-26 DIAGNOSIS — L821 Other seborrheic keratosis: Secondary | ICD-10-CM | POA: Diagnosis not present

## 2022-01-30 DIAGNOSIS — Z23 Encounter for immunization: Secondary | ICD-10-CM | POA: Diagnosis not present

## 2022-01-30 DIAGNOSIS — E782 Mixed hyperlipidemia: Secondary | ICD-10-CM | POA: Diagnosis not present

## 2022-01-30 DIAGNOSIS — E039 Hypothyroidism, unspecified: Secondary | ICD-10-CM | POA: Diagnosis not present

## 2022-01-30 DIAGNOSIS — Z0001 Encounter for general adult medical examination with abnormal findings: Secondary | ICD-10-CM | POA: Diagnosis not present

## 2022-03-14 DIAGNOSIS — E039 Hypothyroidism, unspecified: Secondary | ICD-10-CM | POA: Diagnosis not present

## 2022-04-13 DIAGNOSIS — E039 Hypothyroidism, unspecified: Secondary | ICD-10-CM | POA: Diagnosis not present

## 2022-04-24 ENCOUNTER — Other Ambulatory Visit: Payer: Self-pay | Admitting: Obstetrics and Gynecology

## 2022-04-24 DIAGNOSIS — N63 Unspecified lump in unspecified breast: Secondary | ICD-10-CM

## 2022-04-30 DIAGNOSIS — E782 Mixed hyperlipidemia: Secondary | ICD-10-CM | POA: Diagnosis not present

## 2022-05-22 ENCOUNTER — Ambulatory Visit: Payer: BC Managed Care – PPO

## 2022-05-22 ENCOUNTER — Ambulatory Visit
Admission: RE | Admit: 2022-05-22 | Discharge: 2022-05-22 | Disposition: A | Discharge status: Home / Self Care | Payer: BC Managed Care – PPO | Source: Ambulatory Visit | Attending: Obstetrics and Gynecology | Admitting: Obstetrics and Gynecology

## 2022-05-22 DIAGNOSIS — R928 Other abnormal and inconclusive findings on diagnostic imaging of breast: Secondary | ICD-10-CM | POA: Diagnosis not present

## 2022-05-22 DIAGNOSIS — N63 Unspecified lump in unspecified breast: Secondary | ICD-10-CM

## 2022-07-23 DIAGNOSIS — K648 Other hemorrhoids: Secondary | ICD-10-CM | POA: Diagnosis not present

## 2022-07-23 DIAGNOSIS — K5730 Diverticulosis of large intestine without perforation or abscess without bleeding: Secondary | ICD-10-CM | POA: Diagnosis not present

## 2022-07-23 DIAGNOSIS — D123 Benign neoplasm of transverse colon: Secondary | ICD-10-CM | POA: Diagnosis not present

## 2022-07-23 DIAGNOSIS — Z1211 Encounter for screening for malignant neoplasm of colon: Secondary | ICD-10-CM | POA: Diagnosis not present

## 2022-12-11 DIAGNOSIS — L03114 Cellulitis of left upper limb: Secondary | ICD-10-CM | POA: Diagnosis not present

## 2022-12-11 DIAGNOSIS — S61412A Laceration without foreign body of left hand, initial encounter: Secondary | ICD-10-CM | POA: Diagnosis not present

## 2022-12-11 DIAGNOSIS — Z23 Encounter for immunization: Secondary | ICD-10-CM | POA: Diagnosis not present

## 2022-12-11 DIAGNOSIS — W208XXA Other cause of strike by thrown, projected or falling object, initial encounter: Secondary | ICD-10-CM | POA: Diagnosis not present

## 2023-03-21 DIAGNOSIS — E782 Mixed hyperlipidemia: Secondary | ICD-10-CM | POA: Diagnosis not present

## 2023-03-21 DIAGNOSIS — E039 Hypothyroidism, unspecified: Secondary | ICD-10-CM | POA: Diagnosis not present

## 2023-03-21 DIAGNOSIS — Z23 Encounter for immunization: Secondary | ICD-10-CM | POA: Diagnosis not present

## 2023-03-21 DIAGNOSIS — Z0000 Encounter for general adult medical examination without abnormal findings: Secondary | ICD-10-CM | POA: Diagnosis not present

## 2023-05-20 DIAGNOSIS — Z79899 Other long term (current) drug therapy: Secondary | ICD-10-CM | POA: Diagnosis not present

## 2023-07-17 DIAGNOSIS — Z79899 Other long term (current) drug therapy: Secondary | ICD-10-CM | POA: Diagnosis not present

## 2023-10-14 DIAGNOSIS — B309 Viral conjunctivitis, unspecified: Secondary | ICD-10-CM | POA: Diagnosis not present

## 2024-01-16 ENCOUNTER — Other Ambulatory Visit (HOSPITAL_BASED_OUTPATIENT_CLINIC_OR_DEPARTMENT_OTHER): Payer: Self-pay | Admitting: Obstetrics and Gynecology

## 2024-01-16 DIAGNOSIS — Z6830 Body mass index (BMI) 30.0-30.9, adult: Secondary | ICD-10-CM | POA: Diagnosis not present

## 2024-01-16 DIAGNOSIS — R87810 Cervical high risk human papillomavirus (HPV) DNA test positive: Secondary | ICD-10-CM | POA: Diagnosis not present

## 2024-01-16 DIAGNOSIS — Z01419 Encounter for gynecological examination (general) (routine) without abnormal findings: Secondary | ICD-10-CM | POA: Diagnosis not present

## 2024-01-16 DIAGNOSIS — Z124 Encounter for screening for malignant neoplasm of cervix: Secondary | ICD-10-CM | POA: Diagnosis not present

## 2024-01-16 DIAGNOSIS — Z8249 Family history of ischemic heart disease and other diseases of the circulatory system: Secondary | ICD-10-CM

## 2024-01-16 DIAGNOSIS — N390 Urinary tract infection, site not specified: Secondary | ICD-10-CM | POA: Diagnosis not present

## 2024-01-16 DIAGNOSIS — Z1231 Encounter for screening mammogram for malignant neoplasm of breast: Secondary | ICD-10-CM | POA: Diagnosis not present

## 2024-01-16 DIAGNOSIS — Z1151 Encounter for screening for human papillomavirus (HPV): Secondary | ICD-10-CM | POA: Diagnosis not present

## 2024-01-29 DIAGNOSIS — R87810 Cervical high risk human papillomavirus (HPV) DNA test positive: Secondary | ICD-10-CM | POA: Diagnosis not present

## 2024-02-25 DIAGNOSIS — R3121 Asymptomatic microscopic hematuria: Secondary | ICD-10-CM | POA: Diagnosis not present

## 2024-03-23 DIAGNOSIS — E782 Mixed hyperlipidemia: Secondary | ICD-10-CM | POA: Diagnosis not present

## 2024-03-23 DIAGNOSIS — R41840 Attention and concentration deficit: Secondary | ICD-10-CM | POA: Diagnosis not present

## 2024-03-23 DIAGNOSIS — E039 Hypothyroidism, unspecified: Secondary | ICD-10-CM | POA: Diagnosis not present

## 2024-03-23 DIAGNOSIS — Z0000 Encounter for general adult medical examination without abnormal findings: Secondary | ICD-10-CM | POA: Diagnosis not present

## 2024-03-26 DIAGNOSIS — Z13820 Encounter for screening for osteoporosis: Secondary | ICD-10-CM | POA: Diagnosis not present

## 2024-06-17 DIAGNOSIS — R41840 Attention and concentration deficit: Secondary | ICD-10-CM | POA: Diagnosis not present

## 2024-06-23 DIAGNOSIS — F419 Anxiety disorder, unspecified: Secondary | ICD-10-CM | POA: Diagnosis not present

## 2024-06-23 DIAGNOSIS — R41840 Attention and concentration deficit: Secondary | ICD-10-CM | POA: Diagnosis not present

## 2024-07-02 DIAGNOSIS — G3184 Mild cognitive impairment, so stated: Secondary | ICD-10-CM | POA: Diagnosis not present

## 2024-07-02 DIAGNOSIS — E039 Hypothyroidism, unspecified: Secondary | ICD-10-CM | POA: Diagnosis not present

## 2024-07-09 DIAGNOSIS — R053 Chronic cough: Secondary | ICD-10-CM | POA: Diagnosis not present

## 2024-07-15 DIAGNOSIS — Z23 Encounter for immunization: Secondary | ICD-10-CM | POA: Diagnosis not present

## 2024-07-27 DIAGNOSIS — N390 Urinary tract infection, site not specified: Secondary | ICD-10-CM | POA: Diagnosis not present

## 2024-07-27 DIAGNOSIS — R399 Unspecified symptoms and signs involving the genitourinary system: Secondary | ICD-10-CM | POA: Diagnosis not present

## 2024-07-28 DIAGNOSIS — G3184 Mild cognitive impairment, so stated: Secondary | ICD-10-CM | POA: Diagnosis not present

## 2024-08-07 DIAGNOSIS — F419 Anxiety disorder, unspecified: Secondary | ICD-10-CM | POA: Diagnosis not present

## 2024-08-07 DIAGNOSIS — Z87440 Personal history of urinary (tract) infections: Secondary | ICD-10-CM | POA: Diagnosis not present

## 2024-09-25 DIAGNOSIS — B3731 Acute candidiasis of vulva and vagina: Secondary | ICD-10-CM | POA: Diagnosis not present

## 2024-09-25 DIAGNOSIS — N390 Urinary tract infection, site not specified: Secondary | ICD-10-CM | POA: Diagnosis not present

## 2024-10-09 DIAGNOSIS — F419 Anxiety disorder, unspecified: Secondary | ICD-10-CM | POA: Diagnosis not present

## 2024-10-09 DIAGNOSIS — R300 Dysuria: Secondary | ICD-10-CM | POA: Diagnosis not present

## 2024-10-09 DIAGNOSIS — G3184 Mild cognitive impairment, so stated: Secondary | ICD-10-CM | POA: Diagnosis not present

## 2024-10-09 DIAGNOSIS — K921 Melena: Secondary | ICD-10-CM | POA: Diagnosis not present

## 2025-01-19 ENCOUNTER — Ambulatory Visit: Admitting: Neurology
